# Patient Record
Sex: Male | Born: 1975 | Race: Black or African American | Hispanic: No | Marital: Single | Smoking: Never smoker
Health system: Southern US, Community
[De-identification: ages and names within clinical notes are randomized; demographics above are authoritative.]

## PROBLEM LIST (undated history)

## (undated) DIAGNOSIS — R51 Headache: Secondary | ICD-10-CM

## (undated) DIAGNOSIS — D649 Anemia, unspecified: Secondary | ICD-10-CM

## (undated) DIAGNOSIS — T7840XA Allergy, unspecified, initial encounter: Secondary | ICD-10-CM

## (undated) DIAGNOSIS — R519 Headache, unspecified: Secondary | ICD-10-CM

## (undated) DIAGNOSIS — J45909 Unspecified asthma, uncomplicated: Secondary | ICD-10-CM

## (undated) HISTORY — PX: ELBOW FRACTURE SURGERY: SHX616

## (undated) HISTORY — DX: Allergy, unspecified, initial encounter: T78.40XA

## (undated) HISTORY — PX: OTHER SURGICAL HISTORY: SHX169

---

## 2006-05-02 ENCOUNTER — Emergency Department (HOSPITAL_COMMUNITY): Admission: EM | Admit: 2006-05-02 | Discharge: 2006-05-02 | Payer: Self-pay | Admitting: Emergency Medicine

## 2006-08-23 ENCOUNTER — Emergency Department (HOSPITAL_COMMUNITY): Admission: EM | Admit: 2006-08-23 | Discharge: 2006-08-23 | Payer: Self-pay | Admitting: *Deleted

## 2012-07-23 ENCOUNTER — Emergency Department (HOSPITAL_COMMUNITY)
Admission: EM | Admit: 2012-07-23 | Discharge: 2012-07-23 | Disposition: A | Discharge status: Home / Self Care | Payer: BC Managed Care – PPO | Source: Home / Self Care | Attending: Emergency Medicine | Admitting: Emergency Medicine

## 2012-07-23 ENCOUNTER — Encounter (HOSPITAL_COMMUNITY): Payer: Self-pay | Admitting: *Deleted

## 2012-07-23 DIAGNOSIS — T7840XA Allergy, unspecified, initial encounter: Secondary | ICD-10-CM

## 2012-07-23 DIAGNOSIS — L509 Urticaria, unspecified: Secondary | ICD-10-CM

## 2012-07-23 DIAGNOSIS — T783XXA Angioneurotic edema, initial encounter: Secondary | ICD-10-CM

## 2012-07-23 HISTORY — DX: Allergy, unspecified, initial encounter: T78.40XA

## 2012-07-23 MED ORDER — METHYLPREDNISOLONE SODIUM SUCC 125 MG IJ SOLR
125.0000 mg | Freq: Once | INTRAMUSCULAR | Status: AC
  Administered 2012-07-23: 125 mg via INTRAMUSCULAR

## 2012-07-23 MED ORDER — RANITIDINE HCL 150 MG PO CAPS: 150.0000 mg | ORAL_CAPSULE | Freq: Every day | ORAL | Status: DC

## 2012-07-23 MED ORDER — EPINEPHRINE 0.3 MG/0.3ML IJ SOAJ: INTRAMUSCULAR | Status: DC

## 2012-07-23 MED ORDER — CETIRIZINE HCL 10 MG PO TABS: 10.0000 mg | ORAL_TABLET | Freq: Every day | ORAL | Status: DC

## 2012-07-23 MED ORDER — PREDNISONE 10 MG PO KIT: PACK | ORAL | Status: DC

## 2012-07-23 MED ORDER — METHYLPREDNISOLONE SODIUM SUCC 125 MG IJ SOLR
INTRAMUSCULAR | Status: AC
  Filled 2012-07-23: qty 2

## 2012-07-23 NOTE — ED Provider Notes (Signed)
Medical screening examination/treatment/procedure(s) were performed by non-physician practitioner and as supervising physician I was immediately available for consultation/collaboration.  Leslee Home, M.D.  Reuben Likes, MD 07/23/12 [id number]

## 2012-07-23 NOTE — ED Provider Notes (Signed)
History    CSN: [id number] Arrival date & time 07/23/12  1312  First MD Initiated Contact with Patient 07/23/12 1520     Chief Complaint  Patient presents with  . Allergic Reaction   (Consider location/radiation/quality/duration/timing/severity/associated sxs/prior Treatment) HPI Comments: Allergic reaction, itchy rash, swelling of forehead. Taking Benadryl, no relief. Swelling increasing.  Patient is a 37 y.o. male presenting with allergic reaction.  Allergic Reaction Presenting symptoms: rash   Presenting symptoms: no difficulty swallowing and no wheezing    Past Medical History  Diagnosis Date  . Allergic    History reviewed. No pertinent past surgical history. No family history on file. History  Substance Use Topics  . Smoking status: Never Smoker   . Smokeless tobacco: Not on file  . Alcohol Use: No    Review of Systems  Constitutional: Negative for fever, chills and fatigue.  HENT: Negative for sore throat, facial swelling, mouth sores, trouble swallowing, neck pain, neck stiffness and voice change.   Eyes: Negative for visual disturbance.  Respiratory: Negative for cough, choking, chest tightness, shortness of breath, wheezing and stridor.   Cardiovascular: Negative for chest pain, palpitations and leg swelling.  Gastrointestinal: Negative for nausea, vomiting, abdominal pain, diarrhea and constipation.  Genitourinary: Negative for dysuria, urgency, frequency and hematuria.  Musculoskeletal: Negative for myalgias, joint swelling and arthralgias.       Swelling of scalp and forehead  Skin: Positive for rash.  Allergic/Immunologic: Positive for environmental allergies and food allergies.  Neurological: Negative for dizziness, weakness and light-headedness.    Allergies  Shellfish allergy  Home Medications   Current Outpatient Rx  Name  Route  Sig  Dispense  Refill  . DiphenhydrAMINE HCl (BENADRYL PO)   Oral   Take by mouth.         . cetirizine  (ZYRTEC) 10 MG tablet   Oral   Take 1 tablet (10 mg total) by mouth daily.   30 tablet   0   . EPINEPHrine (EPIPEN 2-PAK) 0.3 mg/0.3 mL SOAJ      If needed for shortness of breath or swelling of lips/tongue, Inject 1 pen into thigh muscle and called 911.  May inject 2nd pen in 5 minutes if needed   1 Device   1   . PredniSONE 10 MG KIT      12 day taper dose pack, use as directed   48 each   0   . ranitidine (ZANTAC) 150 MG capsule   Oral   Take 1 capsule (150 mg total) by mouth daily.   30 capsule   0    BP 116/77  Pulse 62  Temp(Src) 99.8 F (37.7 C) (Oral)  Resp 18  SpO2 100% Physical Exam  Constitutional: He is oriented to person, place, and time. He appears well-developed and well-nourished. No distress.  HENT:  Head: Normocephalic and atraumatic.  Mouth/Throat: Uvula is midline, oropharynx is clear and moist and mucous membranes are normal. No edematous. No posterior oropharyngeal edema.  3+ pitting edema on the forehead and scalp, stopping at the eyebrows, ears, and base of the occiput  Eyes: EOM are normal. Pupils are equal, round, and reactive to light.  Cardiovascular: Normal rate and regular rhythm.  Exam reveals no gallop and no friction rub.   No murmur heard. Pulmonary/Chest: Effort normal and breath sounds normal. No respiratory distress. He has no wheezes. He has no rales.  Abdominal: Soft. There is no tenderness.  Neurological: He is oriented to person, place, and  time.  Skin: Skin is warm and dry. Rash noted. Rash is urticarial (diffuse).  Psychiatric: He has a normal mood and affect. Judgment normal.    ED Course  Procedures (including critical care time) Labs Reviewed - No data to display No results found. 1. Allergic reaction, initial encounter   2. Urticaria   3. Allergic angioedema, initial encounter    Given 125 Solu-Medrol IM today in the office  MDM  We'll treat this with oral steroids, antihistamines, H2 blocker. We'll also give him  an EpiPen, patient instructed to call 911 immediately after using it if he does in fact have to use the EpiPen. He will followup here in 2 days for a recheck.   Meds ordered this encounter  Medications  . DiphenhydrAMINE HCl (BENADRYL PO)    Sig: Take by mouth.  . methylPREDNISolone sodium succinate (SOLU-MEDROL) 125 mg/2 mL injection 125 mg    Sig:   . PredniSONE 10 MG KIT    Sig: 12 day taper dose pack, use as directed    Dispense:  48 each    Refill:  0  . ranitidine (ZANTAC) 150 MG capsule    Sig: Take 1 capsule (150 mg total) by mouth daily.    Dispense:  30 capsule    Refill:  0  . cetirizine (ZYRTEC) 10 MG tablet    Sig: Take 1 tablet (10 mg total) by mouth daily.    Dispense:  30 tablet    Refill:  0  . EPINEPHrine (EPIPEN 2-PAK) 0.3 mg/0.3 mL SOAJ    Sig: If needed for shortness of breath or swelling of lips/tongue, Inject 1 pen into thigh muscle and called 911.  May inject 2nd pen in 5 minutes if needed    Dispense:  1 Device    Refill:  1     Graylon Good, PA-C 07/23/12 1700

## 2012-07-23 NOTE — ED Notes (Signed)
Pt  Reports  Swelling     To  Face  And  Forehead   No  Swelling   To  Mouth  And    Lips           Symptoms  Started    8  Days  Ago  Getting  Worse   Started  Out  As     Slight  Rash       Getting  Worse         Pt  Has  Taking  Benadryl  Without  releif     Pt  Reports       Symptoms   Started  When  He  Went  To  So [place]       No  resp  Problems        Pt  States        Ate  Some     Shellfish            About  That  Time      He  Is  Sitting  Upright on  Exam  Table    Speaking in  Complete  sentances

## 2012-07-25 ENCOUNTER — Emergency Department (INDEPENDENT_AMBULATORY_CARE_PROVIDER_SITE_OTHER)
Admission: EM | Admit: 2012-07-25 | Discharge: 2012-07-25 | Disposition: A | Discharge status: Home / Self Care | Payer: BC Managed Care – PPO | Source: Home / Self Care | Attending: Family Medicine | Admitting: Family Medicine

## 2012-07-25 ENCOUNTER — Encounter (HOSPITAL_COMMUNITY): Payer: Self-pay | Admitting: *Deleted

## 2012-07-25 DIAGNOSIS — L272 Dermatitis due to ingested food: Secondary | ICD-10-CM

## 2012-07-25 MED ORDER — HYDROXYZINE HCL 25 MG PO TABS: 25.0000 mg | ORAL_TABLET | Freq: Three times a day (TID) | ORAL | Status: DC | PRN

## 2012-07-25 NOTE — ED Notes (Signed)
Pt  Was  Seen   2  Days  Ago  For  Facial  Swelling        And  Allergic  Reaction       The  Swelling on  His  Forehead  Is  Much  Better  Still  Has  puffyness   Around  His  Eyes         His  Rash on   The  Rest of  His  Body  Is  Better   He  Is  In no  resp  Distress   He  Is  Sitting  Upright on   Exam table  Speaking  In complete  sentances   He  Has  Not  Gotten his    Epi pen rx  Yet  Due  To  Finances         Yet he  Got  Everything  Karolee Ohs

## 2012-07-25 NOTE — ED Provider Notes (Signed)
History    CSN: [id number] Arrival date & time 07/25/12  1530  First MD Initiated Contact with Patient 07/25/12 1536     Chief Complaint  Patient presents with  . Follow-up   (Consider location/radiation/quality/duration/timing/severity/associated sxs/prior Treatment) HPI Comments: And 37 year old male with history of food allergies to shellfish. Here for followup of with to carry and angioedema symptoms. He was seen on June 6 after he got exposed to arise with shellfish with severe swelling in his forehead, around the eyes and lips. Patient was treated with Solu-Medrol IM and is on day 3/12 of a prednisone taper currently he's also taking ranitidine and cetirizine daily. States symptoms are much better swelling is significantly down. Denies difficulty breathing or swallowing. No wheezing or shortness of breath. Still mild generalized can itchiness. Otherwise doing much better.  Past Medical History  Diagnosis Date  . Allergic    History reviewed. No pertinent past surgical history. No family history on file. History  Substance Use Topics  . Smoking status: Never Smoker   . Smokeless tobacco: Not on file  . Alcohol Use: No    Review of Systems  Constitutional: Negative for fever, chills, diaphoresis, activity change, appetite change and fatigue.  HENT: Negative for congestion, sore throat and trouble swallowing.   Eyes: Negative for photophobia, pain, redness, itching and visual disturbance.  Respiratory: Negative for shortness of breath and wheezing.   Cardiovascular: Negative for leg swelling.  Gastrointestinal: Negative for nausea, vomiting, abdominal pain and diarrhea.  Musculoskeletal: Negative for joint swelling and arthralgias.  Skin: Positive for rash.  Allergic/Immunologic: Positive for food allergies.  Neurological: Negative for dizziness and headaches.    Allergies  Shellfish allergy  Home Medications   Current Outpatient Rx  Name  Route  Sig  Dispense   Refill  . cetirizine (ZYRTEC) 10 MG tablet   Oral   Take 1 tablet (10 mg total) by mouth daily.   30 tablet   0   . EPINEPHrine (EPIPEN 2-PAK) 0.3 mg/0.3 mL SOAJ      If needed for shortness of breath or swelling of lips/tongue, Inject 1 pen into thigh muscle and called 911.  May inject 2nd pen in 5 minutes if needed   1 Device   1   . hydrOXYzine (ATARAX/VISTARIL) 25 MG tablet   Oral   Take 1 tablet (25 mg total) by mouth every 8 (eight) hours as needed for itching.   21 tablet   0   . PredniSONE 10 MG KIT      12 day taper dose pack, use as directed   48 each   0   . ranitidine (ZANTAC) 150 MG capsule   Oral   Take 1 capsule (150 mg total) by mouth daily.   30 capsule   0    BP 119/74  Pulse 59  Temp(Src) 98 F (36.7 C) (Oral)  Resp 16  SpO2 100% Physical Exam  Nursing note and vitals reviewed. Constitutional: He is oriented to person, place, and time. He appears well-developed and well-nourished. No distress.  HENT:  Head: Normocephalic and atraumatic.  Right Ear: External ear normal.  Left Ear: External ear normal.  Nose: Nose normal.  Mouth/Throat: Oropharynx is clear and moist. No oropharyngeal exudate.  No pharyngeal or uvula edema or erythema. Airways patent.  Eyes: Conjunctivae and EOM are normal. Pupils are equal, round, and reactive to light. No scleral icterus.  Still periorbital edema bilaterally. No tender to palpation. No redness.   Neck:  Neck supple. No JVD present.  Cardiovascular: Normal rate, regular rhythm and normal heart sounds.  Exam reveals no gallop and no friction rub.   No murmur heard. Pulmonary/Chest: Effort normal and breath sounds normal. No respiratory distress. He has no wheezes. He has no rales.  Abdominal: Soft. He exhibits no distension. There is no tenderness.  No hepatosplenomegally.  Lymphadenopathy:    He has no cervical adenopathy.  Neurological: He is alert and oriented to person, place, and time.  Skin: Rash noted.  He is not diaphoretic.  Improved facial edema.     ED Course  Procedures (including critical care time) Labs Reviewed - No data to display No results found. 1. Food allergic skin reaction     MDM  Significant improvement of urticaria and angioedema after shellfish ingestion. Continue with prednisone taper, ranitidine and cetirizine. Recommended close to remain is very close possible overnight. Also recommended cold pads around the eyes. Added hydroxyzine. Patient had prescribed EpiPen on last visit. Supportive care and red flags should prompt his return to medical attention discussed with patient and provided in writing.  Sharin Grave, MD 07/25/12 1616

## 2012-08-24 ENCOUNTER — Ambulatory Visit (INDEPENDENT_AMBULATORY_CARE_PROVIDER_SITE_OTHER): Payer: BC Managed Care – PPO | Admitting: Family Medicine

## 2012-08-24 ENCOUNTER — Encounter: Payer: Self-pay | Admitting: Family Medicine

## 2012-08-24 VITALS — BP 100/68 | Temp 98.6°F | Ht 71.75 in | Wt 200.0 lb

## 2012-08-24 DIAGNOSIS — Z23 Encounter for immunization: Secondary | ICD-10-CM

## 2012-08-24 DIAGNOSIS — Z9109 Other allergy status, other than to drugs and biological substances: Secondary | ICD-10-CM | POA: Insufficient documentation

## 2012-08-24 DIAGNOSIS — Z7189 Other specified counseling: Secondary | ICD-10-CM

## 2012-08-24 DIAGNOSIS — Z7689 Persons encountering health services in other specified circumstances: Secondary | ICD-10-CM

## 2012-08-24 DIAGNOSIS — Z136 Encounter for screening for cardiovascular disorders: Secondary | ICD-10-CM

## 2012-08-24 LAB — LIPID PANEL
Total CHOL/HDL Ratio: 4
VLDL: 10.6 mg/dL (ref 0.0–40.0)

## 2012-08-24 LAB — HEMOGLOBIN A1C: Hgb A1c MFr Bld: 6 % (ref 4.6–6.5)

## 2012-08-24 NOTE — Patient Instructions (Signed)
-  We have ordered labs or studies at this visit. It can take up to 1-2 weeks for results and processing. We will contact you with instructions IF your results are abnormal. Normal results will be released to your MYCHART. If you have not heard from us or can not find your results in MYCHART in 2 weeks please contact our office.  -PLEASE SIGN UP FOR MYCHART TODAY   We recommend the following healthy lifestyle measures: - eat a healthy diet consisting of lots of vegetables, fruits, beans, nuts, seeds, healthy meats such as white chicken and fish and whole grains.  - avoid fried foods, fast food, processed foods, sodas, red meet and other fattening foods.  - get a least 150 minutes of aerobic exercise per week.   Follow up in: as needed  

## 2012-08-24 NOTE — Progress Notes (Addendum)
Chief Complaint  Patient presents with  . Establish Care    HPI:  Colin Nichols is here to establish care. Has had change in insurance and had to change PCP - but has not seen doctor in a long time.  Has the following chronic problems and concerns today:  Patient Active Problem List   Diagnosis Date Noted  . Environmental allergies 08/24/2012   Environmental Allergies: -saw allergist as a youngster -only has rare reactions, usually hives  Health Maintenance:  -needs tdap today  ROS: See pertinent positives and negatives per HPI.  Past Medical History  Diagnosis Date  . Allergic   . Allergy     Family History  Problem Relation Age of Onset  . Cancer Mother     cervical  . Hypertension Father   . Diabetes Paternal Grandfather     History   Social History  . Marital Status: Single    Spouse Name: N/A    Number of Children: N/A  . Years of Education: N/A   Social History Main Topics  . Smoking status: Never Smoker   . Smokeless tobacco: None  . Alcohol Use: Yes     Comment: occ; 1-2 drinks rarely  . Drug Use: None  . Sexually Active: None   Other Topics Concern  . 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  May inject 2nd pen in 5 minutes if needed, Disp: 1 Device, Rfl: 1;  [DISCONTINUED] DiphenhydrAMINE HCl (BENADRYL PO), Take by mouth., Disp: , Rfl:   EXAM:  Filed Vitals:   08/24/12 0936  BP: 100/68  Temp: 98.6 F (37 C)    Body mass index is 27.33 kg/(m^2).  GENERAL: vitals reviewed and listed above,  alert, oriented, appears well hydrated and in no acute distress  HEENT: atraumatic, conjunttiva clear, no obvious abnormalities on inspection of external nose and ears  NECK: no obvious masses on inspection  LUNGS: clear to auscultation bilaterally, no wheezes, rales or rhonchi, good air movement  CV: HRRR, no peripheral edema  MS: moves all extremities without noticeable abnormality  PSYCH: pleasant and cooperative, no obvious depression or anxiety  ASSESSMENT AND PLAN:  Discussed the following assessment and plan:  Environmental allergies -advised of signs/symptoms of serious allergic reaction and advised to call 911 if ever experiences these symptoms, offered refill on epi-pen he reports had recent refill and has on hand  Encounter to establish care - Plan: Lipid panel, Hemoglobin A1c   -We reviewed the PMH, PSH, FH, SH, Meds and Allergies. -We provided refills for any medications we will prescribe as needed. -We addressed current concerns per orders and patient instructions. -We have asked for records for pertinent exams, studies, vaccines and notes from previous providers. -We have advised patient to follow up per instructions below. -FASTING LABS   -Patient advised to return or notify a doctor immediately if symptoms worsen or persist or new concerns arise.  Patient Instructions  -We have  ordered labs or studies at this visit. It can take up to 1-2 weeks for results and processing. We will contact you with instructions IF your results are abnormal. Normal results will be released to your [facility] If you have not heard from [place] or can not find your results in [facility] in 2 weeks please contact our office.  -PLEASE SIGN UP FOR MYCHART TODAY   We recommend the following healthy lifestyle measures: - eat a healthy diet consisting of lots of vegetables, fruits, beans, nuts, seeds, healthy meats such as white chicken and fish and whole grains.  - avoid fried foods, fast food,  processed foods, sodas, red meet and other fattening foods.  - get a least 150 minutes of aerobic exercise per week.   Follow up in: as needed      Kriste Basque R.

## 2012-08-24 NOTE — Addendum Note (Signed)
Addended by: Azucena Freed on: 08/24/2012 10:36 AM   Modules accepted: Orders

## 2012-08-25 NOTE — Progress Notes (Signed)
Quick Note:  Called and spoke with pt and pt is aware. ______ 

## 2012-09-01 ENCOUNTER — Ambulatory Visit: Payer: BC Managed Care – PPO | Admitting: Family Medicine

## 2013-02-08 ENCOUNTER — Encounter: Payer: Self-pay | Admitting: Family Medicine

## 2013-02-08 ENCOUNTER — Ambulatory Visit (INDEPENDENT_AMBULATORY_CARE_PROVIDER_SITE_OTHER): Payer: Self-pay | Admitting: Family Medicine

## 2013-02-08 VITALS — BP 108/72 | Temp 98.6°F | Ht 71.0 in | Wt 199.0 lb

## 2013-02-08 DIAGNOSIS — R310 Gross hematuria: Secondary | ICD-10-CM

## 2013-02-08 DIAGNOSIS — R7303 Prediabetes: Secondary | ICD-10-CM

## 2013-02-08 DIAGNOSIS — R7309 Other abnormal glucose: Secondary | ICD-10-CM

## 2013-02-08 DIAGNOSIS — Z0000 Encounter for general adult medical examination without abnormal findings: Secondary | ICD-10-CM

## 2013-02-08 DIAGNOSIS — E785 Hyperlipidemia, unspecified: Secondary | ICD-10-CM

## 2013-02-08 LAB — URINALYSIS, ROUTINE W REFLEX MICROSCOPIC
HGB URINE DIPSTICK: NEGATIVE
KETONES UR: NEGATIVE
Leukocytes, UA: NEGATIVE
Nitrite: NEGATIVE
RBC / HPF: NONE SEEN (ref 0–?)
TOTAL PROTEIN, URINE-UPE24: 30 — AB
URINE GLUCOSE: NEGATIVE
Urobilinogen, UA: 0.2 (ref 0.0–1.0)
pH: 6 (ref 5.0–8.0)

## 2013-02-08 LAB — LIPID PANEL
CHOLESTEROL: 145 mg/dL (ref 0–200)
HDL: 39.9 mg/dL (ref >39.00)
LDL Cholesterol: 99 mg/dL (ref 0–99)
Total CHOL/HDL Ratio: 4
Triglycerides: 31 mg/dL (ref 0.0–149.0)
VLDL: 6.2 mg/dL (ref 0.0–40.0)

## 2013-02-08 LAB — BASIC METABOLIC PANEL
BUN: 14 mg/dL (ref 6–23)
CO2: 30 mEq/L (ref 19–32)
CREATININE: 1.2 mg/dL (ref 0.4–1.5)
Calcium: 9.5 mg/dL (ref 8.4–10.5)
Chloride: 104 mEq/L (ref 96–112)
GFR: 84.29 mL/min (ref >60.00)
Glucose, Bld: 89 mg/dL (ref 70–99)
Potassium: 4.5 mEq/L (ref 3.5–5.1)
Sodium: 141 mEq/L (ref 135–145)

## 2013-02-08 LAB — HEMOGLOBIN A1C: HEMOGLOBIN A1C: 5.8 % (ref 4.6–6.5)

## 2013-02-08 NOTE — Progress Notes (Signed)
Chief Complaint  Patient presents with  . Annual Exam    HPI:  Here for annual exam:  Concerns today: Prediabetes: Doing well, working outside quite a bit. Diet is not great. Has been going to the gym on a regular basis - 2-3 days per week.  Hematuria: -dark urine once a few weeks ago and saw blood in urine once -denies beets, rhubarb, etc -no symptoms -denies: fevers, pain, unexplained weight loss, penile discharge, penile issues, concern for STIs  ROS: See pertinent positives and negatives per HPI. Review of Systems  Constitutional: Negative for fever, chills, weight loss and malaise/fatigue.  HENT: Negative for hearing loss.   Eyes: Negative for blurred vision and double vision.  Respiratory: Negative for shortness of breath.   Cardiovascular: Negative for chest pain, palpitations and leg swelling.  Gastrointestinal: Negative for vomiting, diarrhea, blood in stool and melena.       Had streak of blood on TP after hard stool remotely  Genitourinary: Positive for hematuria. Negative for dysuria, urgency, frequency and flank pain.  Musculoskeletal: Negative for falls.  Skin: Negative for rash.  Neurological: Negative for dizziness.  Endo/Heme/Allergies: Does not bruise/bleed easily.  Psychiatric/Behavioral: Negative for depression.     Past Medical History  Diagnosis Date  . Allergic   . Allergy     Past Surgical History  Procedure Laterality Date  . R ankle surgery      Family History  Problem Relation Age of Onset  . Cancer Mother     cervical  . Hypertension Father   . Diabetes Paternal Grandfather     History   Social History  . Marital Status: Single    Spouse Name: N/A    Number of Children: N/A  . Years of Education: N/A   Social History Main Topics  . Smoking status: Never Smoker   . Smokeless tobacco: None  . Alcohol Use: Yes     Comment: occ; 1-2 drinks rarely  . Drug Use: None  . Sexual Activity: None   Other Topics Concern  . 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  May inject 2nd pen in 5 minutes if needed, Disp: 1 Device, Rfl: 1;  [DISCONTINUED] DiphenhydrAMINE HCl (BENADRYL PO), Take by mouth., Disp: , Rfl:   EXAM:  Filed Vitals:   02/08/13 0943  BP: 108/72  Temp: 98.6 F (37 C)    Body mass index is 27.77 kg/(m^2).  GENERAL: vitals reviewed and listed above, alert, oriented, appears well hydrated and in no acute distress  HEENT: atraumatic, conjunttiva clear, no obvious abnormalities on inspection of external nose and ears  NECK: no obvious masses on inspection  LUNGS: clear to auscultation bilaterally, no wheezes, rales or rhonchi, good air movement  CV: HRRR, no peripheral edema  MS: moves all extremities without noticeable abnormality  PSYCH: pleasant and cooperative, no obvious depression or anxiety  ASSESSMENT AND PLAN:  Discussed the following assessment and plan:  Visit for preventive health examination - Plan: Basic metabolic panel  Prediabetes - Plan: Hemoglobin A1c  Dyslipidemia - Plan: Lipid Panel  Gross hematuria - Plan: Urinalysis with Reflex Microscopic, Culture, Urine  -discussed level a ab USPSTF recommendations -FASTING LABS -UA and culture for gross hematuria a few weeks ago - -we discussed possible serious and likely etiologies, workup and treatment, treatment risks and return precautions -after this discussion, Kandis MannanOmar opted for checking urine today, urologist if hematuria without  infection, return if recurs -of course, we advised Kandis MannanOmar  to return or notify a doctor immediately if symptoms worsen or persist or new concerns arise.  -Patient advised to return or notify a doctor immediately if symptoms worsen or persist or new concerns arise.  Patient Instructions  -We have ordered labs or studies at this visit. It can take up to 1-2 weeks for results and processing. We will contact you with instructions IF your results are abnormal. Normal results will be released to your [facility]. If you have not heard from us or can not find your results in [facility] in 2 weeks please contact our office.  -PLEASE SIGN UP FOR MYCHART TODAY   We recommend the following healthy lifestyle measures: - eat a healthy diet consisting of lots of vegetables, fruits, beans, nuts, seeds, healthy meats such as white chicken and fish and whole grains.  - avoid fried foods, fast food, processed foods, sodas, red meet and other fattening foods.  - get a least 150 minutes of aerobic exercise per week.   Follow up in: 1 year or as needed and if any further urine concerns      Colin Spinner R.

## 2013-02-08 NOTE — Patient Instructions (Signed)
-  We have ordered labs or studies at this visit. It can take up to 1-2 weeks for results and processing. We will contact you with instructions IF your results are abnormal. Normal results will be released to your [facility]. If you have not heard from us or can not find your results in [facility] in 2 weeks please contact our office.  -PLEASE SIGN UP FOR MYCHART TODAY   We recommend the following healthy lifestyle measures: - eat a healthy diet consisting of lots of vegetables, fruits, beans, nuts, seeds, healthy meats such as white chicken and fish and whole grains.  - avoid fried foods, fast food, processed foods, sodas, red meet and other fattening foods.  - get a least 150 minutes of aerobic exercise per week.   Follow up in: 1 year or as needed and if any further urine concerns

## 2013-02-08 NOTE — Progress Notes (Signed)
Pre visit review using our clinic review tool, if applicable. No additional management support is needed unless otherwise documented below in the visit note. 

## 2013-02-10 LAB — URINE CULTURE
Colony Count: NO GROWTH
ORGANISM ID, BACTERIA: NO GROWTH

## 2013-02-10 MED ORDER — SULFAMETHOXAZOLE-TMP DS 800-160 MG PO TABS: 1.0000 | ORAL_TABLET | Freq: Two times a day (BID) | ORAL | Status: DC

## 2013-02-10 NOTE — Addendum Note (Signed)
Addended by: Terressa KoyanagiKIM, Saladin Petrelli R on: 02/10/2013 04:43 PM   Modules accepted: Orders

## 2013-06-03 ENCOUNTER — Telehealth: Payer: Self-pay | Admitting: Family Medicine

## 2013-06-03 MED ORDER — ALBUTEROL SULFATE HFA 108 (90 BASE) MCG/ACT IN AERS: 2.0000 | INHALATION_SPRAY | RESPIRATORY_TRACT | Status: DC | PRN

## 2013-06-03 NOTE — Telephone Encounter (Signed)
You are correct, I do not see that we discussed that he had asthma or ever gave him this.   Can you talk with him and ask if he has used albuterol long term in the past and if so for what?   If he is having mild and occasional wheezing or breathing issue and used albuterol in the past ok to send rx for 1 proair inhaler, 2 puffs q4hours prn, with 1 refill and needs follow up appt in next 1-2 months. Sooner if albuterol not working.   If this is a new symptoms or struggling to breath or feeling unwell needs appointment right away.  Thanks.

## 2013-06-03 NOTE — Telephone Encounter (Signed)
Pt called and req a  Inhaler for his bronchial asthma

## 2013-06-03 NOTE — Telephone Encounter (Signed)
Pt notified, pt states that he had asthma as a kid and grew out of it, and used to take albuterol.  I sent in the rx for proair and scheduled him for an appt with you for a 2 month f/u

## 2013-08-08 ENCOUNTER — Ambulatory Visit: Payer: BC Managed Care – PPO | Admitting: Family Medicine

## 2013-10-25 ENCOUNTER — Ambulatory Visit (INDEPENDENT_AMBULATORY_CARE_PROVIDER_SITE_OTHER): Payer: BC Managed Care – PPO | Admitting: Family Medicine

## 2013-10-25 ENCOUNTER — Encounter: Payer: Self-pay | Admitting: Family Medicine

## 2013-10-25 VITALS — BP 100/82 | HR 78 | Temp 98.5°F | Ht 71.0 in | Wt 202.5 lb

## 2013-10-25 DIAGNOSIS — R739 Hyperglycemia, unspecified: Secondary | ICD-10-CM

## 2013-10-25 DIAGNOSIS — J4520 Mild intermittent asthma, uncomplicated: Secondary | ICD-10-CM

## 2013-10-25 DIAGNOSIS — Z87448 Personal history of other diseases of urinary system: Secondary | ICD-10-CM

## 2013-10-25 DIAGNOSIS — Z113 Encounter for screening for infections with a predominantly sexual mode of transmission: Secondary | ICD-10-CM

## 2013-10-25 DIAGNOSIS — R8290 Unspecified abnormal findings in urine: Secondary | ICD-10-CM

## 2013-10-25 LAB — POCT URINALYSIS DIPSTICK
Blood, UA: NEGATIVE
GLUCOSE UA: NEGATIVE
Leukocytes, UA: NEGATIVE
Nitrite, UA: NEGATIVE
SPEC GRAV UA: 1.02
UROBILINOGEN UA: 2
pH, UA: 6

## 2013-10-25 LAB — BASIC METABOLIC PANEL
BUN: 12 mg/dL (ref 6–23)
CALCIUM: 9.4 mg/dL (ref 8.4–10.5)
CO2: 31 meq/L (ref 19–32)
Chloride: 104 mEq/L (ref 96–112)
Creatinine, Ser: 1.3 mg/dL (ref 0.4–1.5)
GFR: 77.45 mL/min (ref >60.00)
Glucose, Bld: 93 mg/dL (ref 70–99)
Potassium: 4.3 mEq/L (ref 3.5–5.1)
SODIUM: 140 meq/L (ref 135–145)

## 2013-10-25 LAB — URINALYSIS, MICROSCOPIC ONLY

## 2013-10-25 LAB — HEMOGLOBIN A1C: HEMOGLOBIN A1C: 5.7 % (ref 4.6–6.5)

## 2013-10-25 NOTE — Progress Notes (Signed)
No chief complaint on file.   HPI:  Follow up:  Prediabetes:  Doing well, working outside quite a bit.  Diet is ok Has been goinng to the gym on a regular basis - 2-3 days per week.   Hx Hematuria:  -in Jan 2015, some mucus, P, sperm in UA, culture neg - tx for possible prostatitis and he was to return for repeat urine test or see urology - never followed up -reports: no further symptoms -denies: fevers, pain, unexplained weight loss, penile discharge, penile issues  Mild Intermittent Asthma: -dx as a child -rare alb prn use as an adult -reports: only gets asthma symptoms rarely with URI -denies: SOB, wheezing  ROS: See pertinent positives and negatives per HPI.  Past Medical History  Diagnosis Date  . Allergic   . Allergy     Past Surgical History  Procedure Laterality Date  . R ankle surgery      Family History  Problem Relation Age of Onset  . Cancer Mother     cervical  . Hypertension Father   . Diabetes Paternal Grandfather     History   Social History  . Marital Status: Single    Spouse Name: N/A    Number of Children: N/A  . Years of Education: N/A   Social History Main Topics  . Smoking status: Never Smoker   . Smokeless tobacco: None  . Alcohol Use: Yes     Comment: occ; 1-2 drinks rarely  . Drug Use: None  . Sexual Activity: None   Other Topics Concern  . 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  May inject  2nd pen in 5 minutes if needed, Disp: 1 Device, Rfl: 1;  [DISCONTINUED] DiphenhydrAMINE HCl (BENADRYL PO), Take by mouth., Disp: , Rfl:   EXAM:  Filed Vitals:   10/25/13 1341  BP: 100/82  Pulse: 78  Temp: 98.5 F (36.9 C)    Body mass index is 28.26 kg/(m^2).  GENERAL: vitals reviewed and listed above, alert, oriented, appears well hydrated and in no acute distress  HEENT: atraumatic, conjunttiva clear, no obvious abnormalities on inspection of external nose and ears  NECK: no obvious masses on inspection  LUNGS: clear to auscultation bilaterally, no wheezes, rales or rhonchi, good air movement  CV: HRRR, no peripheral edema  MS: moves all extremities without noticeable abnormality  PSYCH: pleasant and cooperative, no obvious depression or anxiety  ASSESSMENT AND PLAN:  Discussed the following assessment and plan:  Hx of hematuria - Plan: Basic metabolic panel, Urinalysis, microscopic only, Urinalysis -symptoms resolved and he never followed up to recheck urine - advised we recheck and if abnormal see urologist.  Routine screening for STI (sexually transmitted infection) - Plan: HIV antibody (with reflex), RPR, GC/chlamydia probe amp, urine - per his request - reports he likes to do this  every year  Hyperglycemia - Plan: Hemoglobin A1C, Basic metabolic panel  Mild intermittent asthma, uncomplicated  -Patient advised to return or notify a doctor immediately if symptoms worsen or persist or new concerns arise.  There are no Patient Instructions on file for this visit.   Kriste Basque R.

## 2013-10-25 NOTE — Progress Notes (Signed)
Pre visit review using our clinic review tool, if applicable. No additional management support is needed unless otherwise documented below in the visit note. 

## 2013-10-25 NOTE — Addendum Note (Signed)
Addended by: Rita OharaHRASHER, Yexalen Deike R on: 10/25/2013 02:01 PM   Modules accepted: Orders

## 2013-10-26 LAB — HIV ANTIBODY (ROUTINE TESTING W REFLEX): HIV: NONREACTIVE

## 2013-10-26 LAB — RPR

## 2013-10-27 LAB — GC/CHLAMYDIA PROBE AMP, URINE
CHLAMYDIA, SWAB/URINE, PCR: NEGATIVE
GC PROBE AMP, URINE: NEGATIVE

## 2013-10-27 NOTE — Addendum Note (Signed)
Addended by: Johnella MoloneyFUNDERBURK, Shivangi Lutz A on: 10/27/2013 10:18 AM   Modules accepted: Orders

## 2015-03-04 ENCOUNTER — Encounter (HOSPITAL_COMMUNITY): Payer: Self-pay

## 2015-03-04 ENCOUNTER — Emergency Department (HOSPITAL_COMMUNITY)
Admission: EM | Admit: 2015-03-04 | Discharge: 2015-03-04 | Disposition: A | Discharge status: Home / Self Care | Payer: BLUE CROSS/BLUE SHIELD | Attending: Emergency Medicine | Admitting: Emergency Medicine

## 2015-03-04 ENCOUNTER — Emergency Department (HOSPITAL_COMMUNITY): Payer: BLUE CROSS/BLUE SHIELD

## 2015-03-04 DIAGNOSIS — Y9289 Other specified places as the place of occurrence of the external cause: Secondary | ICD-10-CM | POA: Insufficient documentation

## 2015-03-04 DIAGNOSIS — S43102A Unspecified dislocation of left acromioclavicular joint, initial encounter: Secondary | ICD-10-CM

## 2015-03-04 DIAGNOSIS — Y998 Other external cause status: Secondary | ICD-10-CM | POA: Insufficient documentation

## 2015-03-04 DIAGNOSIS — S4992XA Unspecified injury of left shoulder and upper arm, initial encounter: Secondary | ICD-10-CM | POA: Diagnosis present

## 2015-03-04 DIAGNOSIS — Y9351 Activity, roller skating (inline) and skateboarding: Secondary | ICD-10-CM | POA: Insufficient documentation

## 2015-03-04 MED ORDER — OXYCODONE-ACETAMINOPHEN 5-325 MG PO TABS
2.0000 | ORAL_TABLET | Freq: Once | ORAL | Status: AC
  Administered 2015-03-04: 1 via ORAL
  Filled 2015-03-04 (×2): qty 2

## 2015-03-04 MED ORDER — OXYCODONE-ACETAMINOPHEN 5-325 MG PO TABS: 2.0000 | ORAL_TABLET | ORAL | Status: DC | PRN

## 2015-03-04 NOTE — Discharge Instructions (Signed)
Mr. Colin Nichols,  Nice meeting you! Please follow-up with orthopedics. Return to the emergency department if you develop numbness or tingling in your hand or arm, color changes in your hand or in your arm. Feel better soon!  S. Lane Hacker, PA-C

## 2015-03-04 NOTE — ED Provider Notes (Signed)
CSN: [id number]     Arrival date & time 03/04/15  1919 History   By signing my name below, I, Evon Slack, attest that this documentation has been prepared under the direction and in the presence of Melton Krebs, PA-C. Electronically Signed: Evon Slack, ED Scribe. 03/04/2015. 8:49 PM.    Chief Complaint  Patient presents with  . Shoulder Injury   The history is provided by the patient. No language interpreter was used.   HPI Comments: Colin Nichols is a 40 y.o. male who presents to the Emergency Department complaining of new left shoulder injury onset today at 5 PM. Pt states that he was skateboarding and fell onto the left shoulder. Pt states that the pain is worse with movement. Pt states that he believes he dislocated his shoulder. Pt doesn't report head injury or LOC. Pt denies numbness or tingling.   Past Medical History  Diagnosis Date  . Allergic   . Allergy    Past Surgical History  Procedure Laterality Date  . R ankle surgery     Family History  Problem Relation Age of Onset  . Cancer Mother     cervical  . Hypertension Father   . Diabetes Paternal Grandfather    Social History  Substance Use Topics  . Smoking status: Never Smoker   . Smokeless tobacco: None  . Alcohol Use: Yes     Comment: occ; 1-2 drinks rarely    Review of Systems  Musculoskeletal: Positive for arthralgias.  Neurological: Negative for syncope and numbness.   10 Systems reviewed and all are negative for acute change except as noted in the HPI.   Allergies  Dust mite extract; Lactose intolerance (gi); Pollen extract; and Shellfish allergy  Home Medications   Prior to Admission medications   Medication Sig Start Date End Date Taking? Authorizing Provider  EPINEPHrine (EPIPEN 2-PAK) 0.3 mg/0.3 mL SOAJ If needed for shortness of breath or swelling of lips/tongue, Inject 1 pen into thigh muscle and called 911.  May inject 2nd pen in 5 minutes if needed 07/23/12   Graylon Good, PA-C   BP 121/77 mmHg  Pulse 84  Temp(Src) 98 F (36.7 C) (Oral)  Resp 20  Ht 6' (1.829 m)  Wt 211 lb (95.709 kg)  BMI 28.61 kg/m2  SpO2 99%   Physical Exam  Constitutional: He is oriented to person, place, and time. He appears well-developed and well-nourished. No distress.  HENT:  Head: Normocephalic and atraumatic.  Eyes: Conjunctivae and EOM are normal.  Neck: Neck supple. No tracheal deviation present.  Cardiovascular: Normal rate.   Pulmonary/Chest: Effort normal. No respiratory distress.  Musculoskeletal: Normal range of motion. He exhibits edema and tenderness.  Tenderness at left shoulder and clavicle. Limited ROM due to pain. NVI BL otherwise.  Neurological: He is alert and oriented to person, place, and time.  Skin: Skin is warm and dry.  Psychiatric: He has a normal mood and affect. His behavior is normal.  Nursing note and vitals reviewed.   ED Course  Procedures  DIAGNOSTIC STUDIES: Oxygen Saturation is 99% on RA, normal by my interpretation.    COORDINATION OF CARE: 8:50 PM-Discussed treatment plan with pt at bedside and pt agreed to plan.   Imaging Review Dg Shoulder Left  03/04/2015  CLINICAL DATA:  40 year old male with fall and left shoulder pain. EXAM: LEFT SHOULDER - 2+ VIEW COMPARISON:  None. FINDINGS: There is approximately 17 mm superior displacement of the distal clavicle in relation to  the acromion. There is widening of the Coracoclavicular distance measuring approximately 25 cm indicative of ligamentous injury. No acute fracture identified. The soft tissues are grossly unremarkable. IMPRESSION: Acromioclavicular joint injury.  No acute fracture. Electronically Signed   By: Elgie Collard M.D.   On: 03/04/2015 21:41   I have personally reviewed and evaluated these images as part of my medical decision-making.  MDM   Final diagnoses:  Acromioclavicular joint separation, left, initial encounter   Patient nontoxic appearing, VSS. Patient  refusing pain meds at this time. Xray demonstrating AC joint injury without acute fracture. Spoke with ortho who advised sling and ortho follow-up for surgery. Patient may be safely discharged with sling and percocet.   I personally performed the services described in this documentation, which was scribed in my presence. The recorded information has been reviewed and is accurate.   Melton Krebs, PA-C 03/07/15 2010  Gerhard Munch, MD 03/08/15 1540

## 2015-03-04 NOTE — ED Notes (Signed)
Denies need for pain med at this time.

## 2015-03-04 NOTE — ED Notes (Signed)
Onset around 5:30p pt was skateboarding, went into grass skateboard stopped pt went flying on ground on left shoulder.  Pt thought he had shoulder back in place but not able to move and is very painful.

## 2015-03-07 ENCOUNTER — Encounter (HOSPITAL_COMMUNITY): Payer: Self-pay | Admitting: *Deleted

## 2015-03-07 ENCOUNTER — Other Ambulatory Visit: Payer: Self-pay | Admitting: Orthopaedic Surgery

## 2015-03-07 NOTE — Progress Notes (Signed)
Pt denies cardiac history, chest pain or sob. 

## 2015-03-08 ENCOUNTER — Encounter (HOSPITAL_COMMUNITY)
Admission: RE | Disposition: A | Discharge status: Home / Self Care | Payer: Self-pay | Source: Ambulatory Visit | Attending: Orthopaedic Surgery

## 2015-03-08 ENCOUNTER — Ambulatory Visit (HOSPITAL_COMMUNITY): Payer: BLUE CROSS/BLUE SHIELD | Admitting: Certified Registered Nurse Anesthetist

## 2015-03-08 ENCOUNTER — Ambulatory Visit (HOSPITAL_COMMUNITY): Payer: BLUE CROSS/BLUE SHIELD

## 2015-03-08 ENCOUNTER — Ambulatory Visit (HOSPITAL_COMMUNITY)
Admission: RE | Admit: 2015-03-08 | Discharge: 2015-03-08 | Disposition: A | Discharge status: Home / Self Care | Payer: BLUE CROSS/BLUE SHIELD | Source: Ambulatory Visit | Attending: Orthopaedic Surgery | Admitting: Orthopaedic Surgery

## 2015-03-08 ENCOUNTER — Encounter (HOSPITAL_COMMUNITY): Payer: Self-pay | Admitting: *Deleted

## 2015-03-08 DIAGNOSIS — S43102A Unspecified dislocation of left acromioclavicular joint, initial encounter: Secondary | ICD-10-CM | POA: Diagnosis present

## 2015-03-08 DIAGNOSIS — Z8781 Personal history of (healed) traumatic fracture: Secondary | ICD-10-CM

## 2015-03-08 DIAGNOSIS — X58XXXA Exposure to other specified factors, initial encounter: Secondary | ICD-10-CM | POA: Insufficient documentation

## 2015-03-08 DIAGNOSIS — Z9889 Other specified postprocedural states: Secondary | ICD-10-CM

## 2015-03-08 DIAGNOSIS — Z419 Encounter for procedure for purposes other than remedying health state, unspecified: Secondary | ICD-10-CM

## 2015-03-08 HISTORY — DX: Anemia, unspecified: D64.9

## 2015-03-08 HISTORY — DX: Headache: R51

## 2015-03-08 HISTORY — DX: Unspecified asthma, uncomplicated: J45.909

## 2015-03-08 HISTORY — PX: ACROMIO-CLAVICULAR JOINT REPAIR: SHX5183

## 2015-03-08 HISTORY — DX: Headache, unspecified: R51.9

## 2015-03-08 LAB — CBC
HEMATOCRIT: 45.7 % (ref 39.0–52.0)
HEMOGLOBIN: 15.5 g/dL (ref 13.0–17.0)
MCH: 30 pg (ref 26.0–34.0)
MCHC: 33.9 g/dL (ref 30.0–36.0)
MCV: 88.4 fL (ref 78.0–100.0)
Platelets: 217 10*3/uL (ref 150–400)
RBC: 5.17 MIL/uL (ref 4.22–5.81)
RDW: 12.2 % (ref 11.5–15.5)
WBC: 4.3 10*3/uL (ref 4.0–10.5)

## 2015-03-08 SURGERY — REPAIR, ACROMIOCLAVICULAR JOINT
Anesthesia: Regional | Site: Shoulder | Laterality: Left

## 2015-03-08 MED ORDER — PROPOFOL 10 MG/ML IV BOLUS
INTRAVENOUS | Status: AC
  Filled 2015-03-08: qty 20

## 2015-03-08 MED ORDER — FENTANYL CITRATE (PF) 100 MCG/2ML IJ SOLN
INTRAMUSCULAR | Status: DC | PRN
  Administered 2015-03-08: 100 ug via INTRAVENOUS

## 2015-03-08 MED ORDER — OXYCODONE HCL 5 MG/5ML PO SOLN: 5.0000 mg | Freq: Once | ORAL | Status: DC | PRN

## 2015-03-08 MED ORDER — OXYCODONE HCL 5 MG PO TABS: 5.0000 mg | ORAL_TABLET | ORAL | Status: DC | PRN

## 2015-03-08 MED ORDER — CEFAZOLIN SODIUM-DEXTROSE 2-3 GM-% IV SOLR
2.0000 g | INTRAVENOUS | Status: AC
  Administered 2015-03-08: 2 g via INTRAVENOUS

## 2015-03-08 MED ORDER — ONDANSETRON HCL 4 MG/2ML IJ SOLN
INTRAMUSCULAR | Status: DC | PRN
  Administered 2015-03-08: 4 mg via INTRAVENOUS

## 2015-03-08 MED ORDER — ACETAMINOPHEN 325 MG PO TABS: 325.0000 mg | ORAL_TABLET | ORAL | Status: DC | PRN

## 2015-03-08 MED ORDER — LACTATED RINGERS IV SOLN
INTRAVENOUS | Status: DC
  Administered 2015-03-08 (×2): via INTRAVENOUS

## 2015-03-08 MED ORDER — GLYCOPYRROLATE 0.2 MG/ML IJ SOLN
INTRAMUSCULAR | Status: DC | PRN
  Administered 2015-03-08: .6 mg via INTRAVENOUS

## 2015-03-08 MED ORDER — HYDROMORPHONE HCL 1 MG/ML IJ SOLN: 0.2500 mg | INTRAMUSCULAR | Status: DC | PRN

## 2015-03-08 MED ORDER — ONDANSETRON HCL 4 MG PO TABS: 4.0000 mg | ORAL_TABLET | Freq: Three times a day (TID) | ORAL | Status: DC | PRN

## 2015-03-08 MED ORDER — MIDAZOLAM HCL 2 MG/2ML IJ SOLN
INTRAMUSCULAR | Status: AC
  Administered 2015-03-08: 2 mg
  Filled 2015-03-08: qty 2

## 2015-03-08 MED ORDER — FENTANYL CITRATE (PF) 250 MCG/5ML IJ SOLN
INTRAMUSCULAR | Status: AC
  Filled 2015-03-08: qty 5

## 2015-03-08 MED ORDER — LIDOCAINE HCL (CARDIAC) 20 MG/ML IV SOLN
INTRAVENOUS | Status: DC | PRN
  Administered 2015-03-08: 60 mg via INTRAVENOUS

## 2015-03-08 MED ORDER — MIDAZOLAM HCL 2 MG/2ML IJ SOLN
INTRAMUSCULAR | Status: AC
  Filled 2015-03-08: qty 2

## 2015-03-08 MED ORDER — METHOCARBAMOL 750 MG PO TABS: 750.0000 mg | ORAL_TABLET | Freq: Two times a day (BID) | ORAL | Status: DC | PRN

## 2015-03-08 MED ORDER — BUPIVACAINE-EPINEPHRINE (PF) 0.5% -1:200000 IJ SOLN
INTRAMUSCULAR | Status: DC | PRN
  Administered 2015-03-08: 30 mL via PERINEURAL

## 2015-03-08 MED ORDER — NEOSTIGMINE METHYLSULFATE 10 MG/10ML IV SOLN
INTRAVENOUS | Status: DC | PRN
  Administered 2015-03-08: 4 mg via INTRAVENOUS

## 2015-03-08 MED ORDER — ROCURONIUM BROMIDE 100 MG/10ML IV SOLN
INTRAVENOUS | Status: DC | PRN
  Administered 2015-03-08: 50 mg via INTRAVENOUS

## 2015-03-08 MED ORDER — PROPOFOL 10 MG/ML IV BOLUS
INTRAVENOUS | Status: DC | PRN
  Administered 2015-03-08: 200 mg via INTRAVENOUS

## 2015-03-08 MED ORDER — OXYCODONE HCL ER 10 MG PO T12A: 10.0000 mg | EXTENDED_RELEASE_TABLET | Freq: Two times a day (BID) | ORAL | Status: DC

## 2015-03-08 MED ORDER — SENNOSIDES-DOCUSATE SODIUM 8.6-50 MG PO TABS: 1.0000 | ORAL_TABLET | Freq: Every evening | ORAL | Status: DC | PRN

## 2015-03-08 MED ORDER — OXYCODONE HCL 5 MG PO TABS: 5.0000 mg | ORAL_TABLET | Freq: Once | ORAL | Status: DC | PRN

## 2015-03-08 MED ORDER — ACETAMINOPHEN 160 MG/5ML PO SOLN
325.0000 mg | ORAL | Status: DC | PRN
Start: 2015-03-08 — End: 2015-03-08
  Filled 2015-03-08: qty 20.3

## 2015-03-08 MED ORDER — CEFAZOLIN SODIUM-DEXTROSE 2-3 GM-% IV SOLR
INTRAVENOUS | Status: AC
  Filled 2015-03-08: qty 50

## 2015-03-08 MED ORDER — FENTANYL CITRATE (PF) 100 MCG/2ML IJ SOLN
INTRAMUSCULAR | Status: AC
  Administered 2015-03-08: 100 ug
  Filled 2015-03-08: qty 2

## 2015-03-08 MED ORDER — PHENYLEPHRINE HCL 10 MG/ML IJ SOLN
INTRAMUSCULAR | Status: DC | PRN
  Administered 2015-03-08 (×2): 40 ug via INTRAVENOUS
  Administered 2015-03-08: 80 ug via INTRAVENOUS

## 2015-03-08 SURGICAL SUPPLY — 61 items
BIT DRILL CANNULTD 2.4MMAC REP (DRILL) IMPLANT
BLADE AVERAGE 25X9 (BLADE) IMPLANT
BUTTON DOGBONE (Orthopedic Implant) ×2 IMPLANT
CHLORAPREP W/TINT 26ML (MISCELLANEOUS) ×2 IMPLANT
COVER SURGICAL LIGHT HANDLE (MISCELLANEOUS) ×2 IMPLANT
DRAPE C-ARM 42X72 X-RAY (DRAPES) IMPLANT
DRAPE IMP U-DRAPE 54X76 (DRAPES) ×2 IMPLANT
DRAPE INCISE IOBAN 66X45 STRL (DRAPES) ×2 IMPLANT
DRAPE SHEET LG 3/4 BI-LAMINATE (DRAPES) ×1 IMPLANT
DRAPE SURG 17X23 STRL (DRAPES) ×4 IMPLANT
DRAPE U-SHAPE 47X51 STRL (DRAPES) ×2 IMPLANT
DRILL CANNULATED 2.4MM AC REP (DRILL) ×2
DRSG MEPILEX BORDER 4X12 (GAUZE/BANDAGES/DRESSINGS) ×1 IMPLANT
DRSG MEPILEX BORDER 4X8 (GAUZE/BANDAGES/DRESSINGS) ×2 IMPLANT
ELECT REM PT RETURN 9FT ADLT (ELECTROSURGICAL)
ELECTRODE REM PT RTRN 9FT ADLT (ELECTROSURGICAL) IMPLANT
GLOVE BIOGEL PI IND STRL 7.0 (GLOVE) ×1 IMPLANT
GLOVE BIOGEL PI IND STRL 8 (GLOVE) ×1 IMPLANT
GLOVE BIOGEL PI INDICATOR 7.0 (GLOVE) ×1
GLOVE BIOGEL PI INDICATOR 8 (GLOVE) ×1
GLOVE PROGUARD SZ 7 1/2 (GLOVE) ×2 IMPLANT
GLOVE SKINSENSE NS SZ7.5 (GLOVE) ×1
GLOVE SKINSENSE STRL SZ7.5 (GLOVE) ×1 IMPLANT
GOWN STRL REUS W/ TWL LRG LVL3 (GOWN DISPOSABLE) ×2 IMPLANT
GOWN STRL REUS W/ TWL XL LVL3 (GOWN DISPOSABLE) ×1 IMPLANT
GOWN STRL REUS W/TWL LRG LVL3 (GOWN DISPOSABLE) ×4
GOWN STRL REUS W/TWL XL LVL3 (GOWN DISPOSABLE) ×2
KIT BASIN OR (CUSTOM PROCEDURE TRAY) ×2 IMPLANT
KIT BIO-TENODESIS 3X8 DISP (MISCELLANEOUS)
KIT INSRT BABSR STRL DISP BTN (MISCELLANEOUS) IMPLANT
KIT ROOM TURNOVER OR (KITS) ×2 IMPLANT
LOOP FIBERTAPE 2MM 8 WHT BLK (VASCULAR PRODUCTS) ×1 IMPLANT
LOOP FIBERTAPE 2MM BLUE (VASCULAR PRODUCTS) ×1 IMPLANT
MANIFOLD NEPTUNE II (INSTRUMENTS) ×2 IMPLANT
NDL HYPO 25GX1X1/2 BEV (NEEDLE) ×1 IMPLANT
NEEDLE HYPO 25GX1X1/2 BEV (NEEDLE) ×2 IMPLANT
NS IRRIG 1000ML POUR BTL (IV SOLUTION) ×2 IMPLANT
PACK SHOULDER (CUSTOM PROCEDURE TRAY) ×2 IMPLANT
PACK UNIVERSAL I (CUSTOM PROCEDURE TRAY) ×2 IMPLANT
PAD ARMBOARD 7.5X6 YLW CONV (MISCELLANEOUS) ×2 IMPLANT
RETRIEVER SUT HEWSON (MISCELLANEOUS) ×2 IMPLANT
SLING ARM LRG ADULT FOAM STRAP (SOFTGOODS) ×2 IMPLANT
SLING ARM MED ADULT FOAM STRAP (SOFTGOODS) IMPLANT
STRIP CLOSURE SKIN 1/2X4 (GAUZE/BANDAGES/DRESSINGS) ×2 IMPLANT
SUCTION FRAZIER HANDLE 10FR (MISCELLANEOUS) ×1
SUCTION TUBE FRAZIER 10FR DISP (MISCELLANEOUS) ×1 IMPLANT
SUPPORT WRAP ARM LG (MISCELLANEOUS) IMPLANT
SUT FIBERWIRE #2 38 T-5 BLUE (SUTURE) ×2
SUT MON AB 4-0 PC3 18 (SUTURE) ×2 IMPLANT
SUT PDS AB 1 CT  36 (SUTURE) ×1
SUT PDS AB 1 CT 36 (SUTURE) ×1 IMPLANT
SUT VIC AB 0 CT1 27 (SUTURE) ×2
SUT VIC AB 0 CT1 27XBRD ANBCTR (SUTURE) ×1 IMPLANT
SUT VIC AB 2-0 CT1 27 (SUTURE) ×2
SUT VIC AB 2-0 CT1 TAPERPNT 27 (SUTURE) ×1 IMPLANT
SUTURE FIBERWR #2 38 T-5 BLUE (SUTURE) ×1 IMPLANT
SYR CONTROL 10ML LL (SYRINGE) ×2 IMPLANT
TAPE FIBER 2MM 7IN #2 BLUE (SUTURE) IMPLANT
TOWEL OR 17X24 6PK STRL BLUE (TOWEL DISPOSABLE) ×2 IMPLANT
TOWEL OR 17X26 10 PK STRL BLUE (TOWEL DISPOSABLE) ×2 IMPLANT
WATER STERILE IRR 1000ML POUR (IV SOLUTION) ×2 IMPLANT

## 2015-03-08 NOTE — H&P (Signed)
    PREOPERATIVE H&P  Chief Complaint: left acromioclavicular joint separation  HPI: Colin Nichols is a 40 y.o. male who presents for surgical treatment of left acromioclavicular joint separation.  He denies any changes in medical history.  Past Medical History  Diagnosis Date  . Allergic   . Allergy   . Asthma   . Headache   . Anemia     hx    Past Surgical History  Procedure Laterality Date  . R ankle surgery    . Elbow fracture surgery     Social History   Social History  . Marital Status: Single    Spouse Name: N/A  . Number of Children: N/A  . Years of Education: N/A   Social History Main Topics  . Smoking status: Never Smoker   . Smokeless tobacco: Never Used  . Alcohol Use: Yes     Comment: occ; 1-2 drinks rarely  . Drug Use: No  . Sexual Activity: Not Asked   Other Topics Concern  . 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 Cancer Mother     cervical  . Hypertension Father   . Diabetes Paternal Grandfather    Allergies  Allergen Reactions  . Dust Mite Extract   . Lactose Intolerance (Gi)   . Pollen Extract   . Shellfish Allergy    Prior to Admission medications   Medication Sig Start Date End Date Taking? Authorizing Provider  EPINEPHrine (EPIPEN 2-PAK) 0.3 mg/0.3 mL SOAJ If needed for shortness of breath or swelling of lips/tongue, Inject 1 pen into thigh muscle and called 911.  May inject 2nd pen in 5 minutes if needed 07/23/12  Yes Graylon Good, PA-C  oxyCODONE-acetaminophen (PERCOCET/ROXICET) 5-325 MG tablet Take 2 tablets by mouth every 4 (four)  hours as needed for severe pain. 03/04/15  Yes Melton Krebs, PA-C     Positive ROS: All other systems have been reviewed and were otherwise negative with the exception of those mentioned in the HPI and as above.  Physical Exam: General: Alert, no acute distress Cardiovascular: No pedal edema Respiratory: No cyanosis, no use of accessory musculature GI: abdomen soft Skin: No lesions in the area of chief complaint Neurologic: Sensation intact distally Psychiatric: Patient is competent for consent with normal mood and affect Lymphatic: no lymphedema  MUSCULOSKELETAL: exam stable  Assessment: left acromioclavicular joint separation  Plan: Plan for Procedure(s): LEFT ACROMIOCLAVICULAR JOINT RECONSTRUCTION  The risks benefits and alternatives were discussed with the patient including but not limited to the risks of nonoperative treatment, versus surgical intervention including infection, bleeding, nerve injury,  blood clots, cardiopulmonary complications, morbidity, mortality, among others, and they were willing to proceed.   Cheral Almas, MD   03/08/2015 7:31 AM

## 2015-03-08 NOTE — Transfer of Care (Signed)
Immediate Anesthesia Transfer of Care Note  Patient: Colin Nichols  Procedure(s) Performed: Procedure(s): LEFT ACROMIOCLAVICULAR JOINT RECONSTRUCTION (Left)  Patient Location: PACU  Anesthesia Type:General and Regional  Level of Consciousness: awake, alert  and oriented  Airway & Oxygen Therapy: Patient Spontanous Breathing and Patient connected to nasal cannula oxygen  Post-op Assessment: Report given to RN and Post -op Vital signs reviewed and stable  Post vital signs: Reviewed and stable  Last Vitals:  Filed Vitals:   03/08/15 1316 03/08/15 1509  BP: 119/81 126/82  Pulse: 77   Temp: 37.1 C   Resp: 18     Complications: No apparent anesthesia complications

## 2015-03-08 NOTE — Discharge Instructions (Signed)
Postoperative instructions:  Weightbearing: non weight bearing, wear sling at all times until your first follow up appointment  Keep your dressing and/or splint clean and dry at all times.  You can remove your dressing on post-operative day #3 and change with a dry/sterile dressing or Band-Aids as needed thereafter.    Incision instructions:  Do not soak your incision for 3 weeks after surgery.  If the incision gets wet, pat dry and do not scrub the incision.  May shower 5 days after surgery.  Pain control:  You have been given a prescription to be taken as directed for post-operative pain control.  In addition, elevate the operative extremity above the heart at all times to prevent swelling and throbbing pain.  Take over-the-counter Colace,  by mouth twice a day while taking narcotic pain medications to help prevent constipation.  Follow up appointments: 1) 10-14 days for suture removal and wound check. 2) Dr. Roda Shutters as scheduled.   -------------------------------------------------------------------------------------------------------------  After Surgery Pain Control:  After your surgery, post-surgical discomfort or pain is likely. This discomfort can last several days to a few weeks. At certain times of the day your discomfort may be more intense.  Did you receive a nerve block?  A nerve block can provide pain relief for one hour to two days after your surgery. As long as the nerve block is working, you will experience little or no sensation in the area the surgeon operated on.  As the nerve block wears off, you will begin to experience pain or discomfort. It is very important that you begin taking your prescribed pain medication before the nerve block fully wears off. Treating your pain at the first sign of the block wearing off will ensure your pain is better controlled and more tolerable when full-sensation returns. Do not wait until the pain is intolerable, as the medicine will be less  effective. It is better to treat pain in advance than to try and catch up.  General Anesthesia:  If you did not receive a nerve block during your surgery, you will need to start taking your pain medication shortly after your surgery and should continue to do so as prescribed by your surgeon.  Pain Medication:  Most commonly we prescribe Vicodin and Percocet for post-operative pain. Both of these medications contain a combination of acetaminophen (Tylenol) and a narcotic to help control pain.   It takes between 30 and 45 minutes before pain medication starts to work. It is important to take your medication before your pain level gets too intense.   Nausea is a common side effect of many pain medications. You will want to eat something before taking your pain medicine to help prevent nausea.   If you are taking a prescription pain medication that contains acetaminophen, we recommend that you do not take additional over the counter acetaminophen (Tylenol).  Other pain relieving options:   Using a cold pack to ice the affected area a few times a day (15 to 20 minutes at a time) can help to relieve pain, reduce swelling and bruising.   Elevation of the affected area can also help to reduce pain and swelling.

## 2015-03-08 NOTE — Anesthesia Preprocedure Evaluation (Signed)
Anesthesia Evaluation  Patient identified by MRN, date of birth, ID band Patient awake    Reviewed: Allergy & Precautions, NPO status , Patient's Chart, lab work & pertinent test results  History of Anesthesia Complications Negative for: history of anesthetic complications  Airway Mallampati: II  TM Distance: >3 FB Neck ROM: Full    Dental  (+) Teeth Intact   Pulmonary neg pulmonary ROS,    breath sounds clear to auscultation       Cardiovascular negative cardio ROS   Rhythm:Regular     Neuro/Psych  Headaches, negative psych ROS   GI/Hepatic negative GI ROS, Neg liver ROS,   Endo/Other  negative endocrine ROS  Renal/GU negative Renal ROS     Musculoskeletal   Abdominal   Peds  Hematology negative hematology ROS (+)   Anesthesia Other Findings   Reproductive/Obstetrics                             Anesthesia Physical Anesthesia Plan  ASA: I  Anesthesia Plan: General and Regional   Post-op Pain Management:    Induction: Intravenous  Airway Management Planned: Oral ETT  Additional Equipment: None  Intra-op Plan:   Post-operative Plan: Extubation in OR  Informed Consent: I have reviewed the patients History and Physical, chart, labs and discussed the procedure including the risks, benefits and alternatives for the proposed anesthesia with the patient or authorized representative who has indicated his/her understanding and acceptance.   Dental advisory given  Plan Discussed with: CRNA and Surgeon  Anesthesia Plan Comments:         Anesthesia Quick Evaluation

## 2015-03-08 NOTE — Op Note (Signed)
   Date of Surgery: 03/08/2015  INDICATIONS: Mr. Schewe is a 40 y.o.-year-old male with a left grade 5 AC separation;  The patient did consent to the procedure after discussion of the risks and benefits.  PREOPERATIVE DIAGNOSIS: Left grade 5 AC separation  POSTOPERATIVE DIAGNOSIS: Same.  PROCEDURE: Left AC separation reconstruction  SURGEON: N. Glee Arvin, M.D.  ASSIST: Hart Carwin, RNFA.  ANESTHESIA:  general, regional  IV FLUIDS AND URINE: See anesthesia.  ESTIMATED BLOOD LOSS: 100 mL.  IMPLANTS: Arthrex  DRAINS: none  COMPLICATIONS: None.  DESCRIPTION OF PROCEDURE: The patient was brought to the operating room and placed supine on the operating table.  The patient had been signed prior to the procedure and this was documented. The patient had the anesthesia placed by the anesthesiologist.  A time-out was performed to confirm that this was the correct patient, site, side and location. The patient did receive antibiotics prior to the incision and was re-dosed during the procedure as needed at indicated intervals.  A tourniquet not placed.  The patient had the operative extremity prepped and draped in the standard surgical fashion.    He was placed in the beachchair position. A longitudinal incision from the acromioclavicular joint down towards the tip of the coracoid was used.  Full-thickness flaps were created. The fascia was released. The acromioclavicular joint was first identified. The acromioclavicular ligament was completely torn. The distal clavicle was superiorly displaced. We then created a tunnel deep to the deltoid muscle down towards the coracoid. The coracoid was then exposed. We then drilled the appropriate holes through the distal clavicle and the coracoid. A nitinol wire was passed from superior to inferior and then a suture with dog bone was passed back up through the holes using the wire. The dog bone was then flipped. Fluoroscopy was used to confirm this. We then  reduced the acromioclavicular joint. Placed a another dog bone on the superior aspect of the distal clavicle and this was tightened down. Final fluoroscopy x-rays were taken. The wound was thoroughly irrigated. The acromioclavicular ligament was repaired with 0 Vicryl. The rest of the surgical wound was closed in layer fashion using 2-0 Vicryl and 4-0 Monocryl. Steri-Strips were applied. Sterile dressings were applied. Patient tolerated procedure well and had no immediate complications.  POSTOPERATIVE PLAN: Patient will be nonweightbearing to the left upper extremity.  Mayra Reel, MD [facility] [phone] 5:10 PM

## 2015-03-08 NOTE — Anesthesia Procedure Notes (Addendum)
Anesthesia Regional Block:  Interscalene brachial plexus block  Pre-Anesthetic Checklist: ,, timeout performed, Correct Patient, Correct Site, Correct Laterality, Correct Procedure, Correct Position, site marked, Risks and benefits discussed,  Surgical consent,  Pre-op evaluation,  At surgeon's request and post-op pain management  Laterality: Upper and Left  Prep: chloraprep       Needles:  Injection technique: Single-shot  Needle Type: Echogenic Stimulator Needle          Additional Needles:  Procedures: ultrasound guided (picture in chart) and nerve stimulator Interscalene brachial plexus block  Nerve Stimulator or Paresthesia:  Response: deltoid, 0.5 mA,   Additional Responses:   Narrative:  Injection made incrementally with aspirations every 5 mL.  Performed by: Personally  Anesthesiologist: Val Eagle  Additional Notes: H+P and labs reviewed, risks and benefits discussed with patient, procedure tolerated well without complications   Procedure Name: Intubation Date/Time: 03/08/2015 3:42 PM Performed by: Dairl Ponder Pre-anesthesia Checklist: Patient identified, Timeout performed, Emergency Drugs available, Suction available and Patient being monitored Patient Re-evaluated:Patient Re-evaluated prior to inductionOxygen Delivery Method: Circle system utilized Preoxygenation: Pre-oxygenation with 100% oxygen Intubation Type: IV induction Ventilation: Mask ventilation without difficulty Grade View: Grade I Tube type: Oral Tube size: 7.5 mm Number of attempts: 1 Airway Equipment and Method: Stylet Placement Confirmation: ETT inserted through vocal cords under direct vision,  breath sounds checked- equal and bilateral and positive ETCO2 Secured at: 23 cm Tube secured with: Tape Dental Injury: Teeth and Oropharynx as per pre-operative assessment

## 2015-03-09 NOTE — Anesthesia Postprocedure Evaluation (Signed)
Anesthesia Post Note  Patient: Colin Nichols  Procedure(s) Performed: Procedure(s) (LRB): LEFT ACROMIOCLAVICULAR JOINT RECONSTRUCTION (Left)  Patient location during evaluation: PACU Anesthesia Type: General and Regional Level of consciousness: awake and alert Pain management: pain level controlled Vital Signs Assessment: post-procedure vital signs reviewed and stable Respiratory status: spontaneous breathing, nonlabored ventilation, respiratory function stable and patient connected to nasal cannula oxygen Cardiovascular status: blood pressure returned to baseline and stable Postop Assessment: no signs of nausea or vomiting Anesthetic complications: no    Last Vitals:  Filed Vitals:   03/08/15 1900 03/08/15 1930  BP:    Pulse: 76   Temp:  36.8 C  Resp: 19     Last Pain:  Filed Vitals:   03/08/15 1942  PainSc: 0-No pain                 Phillips Grout

## 2015-03-12 ENCOUNTER — Encounter (HOSPITAL_COMMUNITY): Payer: Self-pay | Admitting: Orthopaedic Surgery

## 2017-05-05 ENCOUNTER — Ambulatory Visit: Payer: BLUE CROSS/BLUE SHIELD | Admitting: Family Medicine

## 2017-05-05 ENCOUNTER — Encounter: Payer: Self-pay | Admitting: Family Medicine

## 2017-05-05 VITALS — BP 90/60 | HR 81 | Temp 98.1°F | Ht 71.0 in | Wt 205.9 lb

## 2017-05-05 DIAGNOSIS — J4520 Mild intermittent asthma, uncomplicated: Secondary | ICD-10-CM

## 2017-05-05 DIAGNOSIS — R229 Localized swelling, mass and lump, unspecified: Secondary | ICD-10-CM | POA: Diagnosis not present

## 2017-05-05 DIAGNOSIS — Z9109 Other allergy status, other than to drugs and biological substances: Secondary | ICD-10-CM | POA: Diagnosis not present

## 2017-05-05 DIAGNOSIS — R739 Hyperglycemia, unspecified: Secondary | ICD-10-CM

## 2017-05-05 MED ORDER — ALBUTEROL SULFATE HFA 108 (90 BASE) MCG/ACT IN AERS: 2.0000 | INHALATION_SPRAY | Freq: Four times a day (QID) | RESPIRATORY_TRACT | 0 refills | Status: AC | PRN

## 2017-05-05 MED ORDER — EPINEPHRINE 0.3 MG/0.3ML IJ SOAJ: INTRAMUSCULAR | 1 refills | Status: AC

## 2017-05-05 NOTE — Addendum Note (Signed)
Addended by: Terressa KoyanagiKIM, HANNAH R on: 05/05/2017 05:23 PM   Modules accepted: Level of Service

## 2017-05-05 NOTE — Patient Instructions (Signed)
BEFORE YOU LEAVE: -labs -follow up: CPE/follow up 6 months  Sent refills to pharmacy  Let us know if you want further evaluation of the lump  We have ordered labs or studies at this visit. It can take up to 1-2 weeks for results and processing. IF results require follow up or explanation, we will call you with instructions. Clinically stable results will be released to your [facility]. If you have not heard from us or cannot find your results in [facility] in 2 weeks please contact our office at [phone].  If you are not yet signed up for [facility], please consider signing up.   We recommend the following healthy lifestyle for LIFE: 1) Small portions. But, make sure to get regular (at least 3 per day), healthy meals and small healthy snacks if needed.  2) Eat a healthy clean diet.   TRY TO EAT: -at least 5-7 servings of low sugar, colorful, and nutrient rich vegetables per day (not corn, potatoes or bananas.) -berries are the best choice if you wish to eat fruit (only eat small amounts if trying to reduce weight)  -lean meets (fish, white meat of chicken or [place]) -vegan proteins for some meals - beans or tofu, whole grains, nuts and seeds -Replace bad fats with good fats - good fats include: fish, nuts and seeds, canola oil, olive oil -small amounts of low fat or non fat dairy -small amounts of100 % whole grains - check the lables -drink plenty of water  AVOID: -SUGAR, sweets, anything with added sugar, corn syrup or sweeteners - must read labels as even foods advertised as "healthy" often are loaded with sugar -if you must have a sweetener, small amounts of stevia may be best -sweetened beverages and artificially sweetened beverages -simple starches (rice, bread, potatoes, pasta, chips, etc - small amounts of 100% whole grains are ok) -red meat, pork, butter -fried foods, fast food, processed food, excessive dairy, eggs and coconut.  3)Get at least 150 minutes of sweaty aerobic  exercise per week.  4)Reduce stress - consider counseling, meditation and relaxation to balance other aspects of your life.

## 2017-05-05 NOTE — Progress Notes (Signed)
HPI:  Colin Nichols is a pleasant 42 yo with a PMH prediabetes and mild intermittent asthma here here to re-establish care. Not seen since 2015.  Has the following chronic problems that require follow up and concerns today:  Hx asthma/allergies: -no symptoms currently -occ uses alb if gets a little wheezy around dust -hx shellfish allergy, not sure but his epi may be expired  Hx hyperglycemia -feels diet is ok, very active at work - currently loading/unloading for [employer]  Lump L flank: -noticed a few weeks ago, seemed larger, now smaller -no pain, redness, fevers, drainage per his report   ROS negative for unless reported above: fevers, unintentional weight loss, hearing or vision loss, chest pain, palpitations, struggling to breath, hemoptysis, melena, hematochezia, hematuria, falls, loc, si, thoughts of self harm  Past Medical History:  Diagnosis Date  . Allergic   . Allergy   . Anemia    hx   . Asthma   . Headache     Past Surgical History:  Procedure Laterality Date  . ACROMIO-CLAVICULAR JOINT REPAIR Left 03/08/2015   Procedure: LEFT ACROMIOCLAVICULAR JOINT RECONSTRUCTION;  Surgeon: Colin KosNaiping M Xu, MD;  Location: [facility] OR;  Service: Orthopedics;  Laterality: Left;  . ELBOW FRACTURE SURGERY    . R ankle surgery      Family History  Problem Relation Age of Onset  . Cancer Mother        cervical  . Hypertension Father   . Diabetes Paternal Grandfather     Social History   Socioeconomic History  . Marital status: Single    Spouse name: Not on file  . Number of children: Not on file  . Years of education: Not on file  . Highest education level: Not on file  Occupational History  . Not on file  Social Needs  . Financial resource strain: Not on file  . Food insecurity:    Worry: Not on file    Inability: Not on file  . Transportation needs:    Medical: Not on file    Non-medical: Not on file  Tobacco Use  . Smoking status: Never Smoker  . Smokeless  tobacco: Never Used  Substance and Sexual Activity  . Alcohol use: Yes    Comment: occ; 1-2 drinks rarely  . Drug use: No  . Sexual activity: Not on file  Lifestyle  . Physical activity:    Days per week: Not on file    Minutes per session: Not on file  . Stress: Not on file  Relationships  . Social connections:    Talks on phone: Not on file    Gets together: Not on file    Attends religious service: Not on file    Active member of club or organization: Not on file    Attends meetings of clubs or organizations: Not on file    Relationship status: Not on file  Other Topics Concern  . 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  albuterol (PROVENTIL HFA;VENTOLIN HFA) 108 (90 Base) MCG/ACT inhaler, Inhale  2 puffs into the lungs every 6 (six) hours as needed., Disp: 1 Inhaler, Rfl: 0 .  EPINEPHrine (EPIPEN 2-PAK) 0.3 mg/0.3 mL IJ SOAJ injection, If needed for shortness of breath or swelling of lips/tongue, Inject 1 pen into thigh muscle and called 911.  May inject 2nd pen in 5 minutes if needed, Disp: 1 Device, Rfl: 1  EXAM:  Vitals:   05/05/17 1514  BP: 90/60  Pulse: 81  Temp: 98.1 F (36.7 C)    Body mass index is 28.72 kg/m.  GENERAL: vitals reviewed and listed above, alert, oriented, appears well hydrated and in no acute distress  HEENT: atraumatic, conjunttiva clear, no obvious abnormalities on inspection of external nose and ears  NECK: no obvious masses on inspection  LUNGS: clear to auscultation bilaterally, no wheezes, rales or rhonchi, good air movement  CV: HRRR, no peripheral edema  SKIN: soft subcut mass L lat lower flank - mobile, nttp, no  redness, ~1-2 cm in diameter  MS: moves all extremities without noticeable abnormality  PSYCH: pleasant and cooperative, no obvious depression or anxiety  ASSESSMENT AND PLAN:  Discussed the following assessment and plan:  Hyperglycemia - Plan: Hemoglobin A1c, HDL cholesterol, Cholesterol, total -lifestyle recs  Mass of skin -we discussed possible serious and likely etiologies, workup and treatment, treatment risks and return precautions - ? Cyst vs lypoma, offered referral for surgical resection for evaluation -after this discussion, Colin Nichols opted for observation -of course, we advised Colin Nichols  to return or notify a doctor immediately if symptoms worsen or persist or new concerns arise.  Environmental allergies -hx severe shellfish allergy, epi refiled  Mild intermittent reactive airway disease without complication -alb refilled   -Patient advised to return or notify a doctor immediately if symptoms worsen or persist or new concerns arise.  Patient Instructions  BEFORE YOU LEAVE: -labs -follow up: CPE/follow up 6 months  Sent refills to pharmacy  Let us know if you want further evaluation of the lump  We have ordered labs or studies at this visit. It can take up to 1-2 weeks for results and processing. IF results require follow up or explanation, we will call you with instructions. Clinically stable results will be released to your [facility]. If you have not heard from [place] or cannot find your results in [facility] in 2 weeks please contact our office at [phone].  If you are not yet signed up for [facility], please consider signing up.   We recommend the following healthy lifestyle for LIFE: 1) Small portions. But, make sure to get regular (at least 3 per day), healthy meals and small healthy snacks if needed.  2) Eat a healthy clean diet.   TRY TO EAT: -at least 5-7 servings of low sugar, colorful, and nutrient rich vegetables per day (not corn, potatoes or bananas.) -berries  are the best choice if you wish to eat fruit (only eat small amounts if trying to reduce weight)  -lean meets (fish, white meat of chicken or [place]) -vegan proteins for some meals - beans or tofu, whole grains, nuts and seeds -Replace bad fats with good fats - good fats include: fish, nuts and seeds, canola oil, olive oil -small amounts of low fat or non fat dairy -small amounts of100 % whole grains - check the lables -drink plenty of water  AVOID: -SUGAR, sweets, anything with added sugar, corn syrup or sweeteners - must read labels as even foods advertised as "healthy" often are loaded with sugar -if you must have a  sweetener, small amounts of stevia may be best -sweetened beverages and artificially sweetened beverages -simple starches (rice, bread, potatoes, pasta, chips, etc - small amounts of 100% whole grains are ok) -red meat, pork, butter -fried foods, fast food, processed food, excessive dairy, eggs and coconut.  3)Get at least 150 minutes of sweaty aerobic exercise per week.  4)Reduce stress - consider counseling, meditation and relaxation to balance other aspects of your life.           Terressa Koyanagi

## 2017-05-06 LAB — HDL CHOLESTEROL: HDL: 39.2 mg/dL (ref >39.00)

## 2017-05-06 LAB — HEMOGLOBIN A1C: HEMOGLOBIN A1C: 5.8 % (ref 4.6–6.5)

## 2017-05-06 LAB — CHOLESTEROL, TOTAL: CHOLESTEROL: 146 mg/dL (ref 0–200)

## 2017-09-01 IMAGING — DX DG CHEST 1V PORT
1 series · 1 of 1 positions shown · non-contrast
Comparison: None.

CLINICAL DATA: Left AC joint reconstruction.

EXAM:
PORTABLE CHEST 1 VIEW

[chest ap]
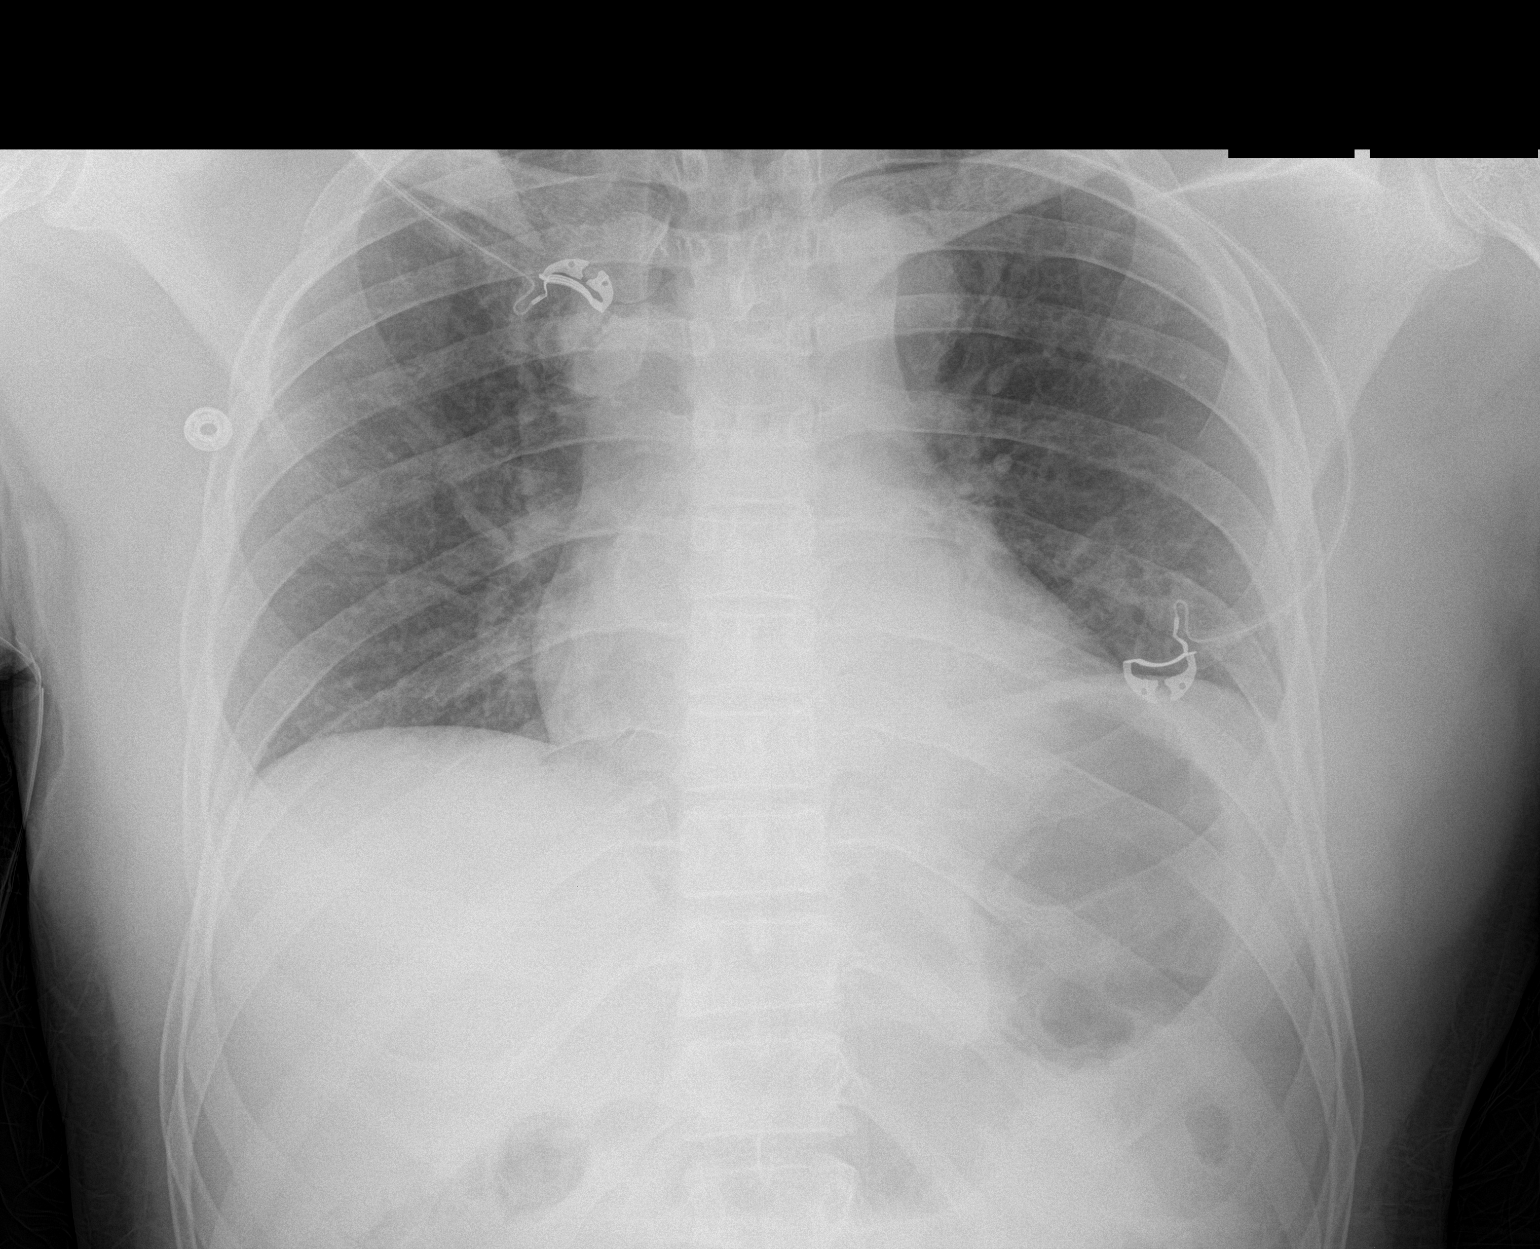

[1 of 1 positions shown; findings below may reference images not displayed]

FINDINGS: Low volume chest with interstitial crowding. There is no edema,
consolidation, effusion, or pneumothorax. No cardiomegaly.
Unremarkable upper mediastinal contours for technique.
IMPRESSION: Low volume but clear chest.

## 2017-11-05 ENCOUNTER — Ambulatory Visit: Payer: BLUE CROSS/BLUE SHIELD | Admitting: Family Medicine

## 2017-11-12 ENCOUNTER — Ambulatory Visit: Payer: Self-pay | Admitting: Family Medicine

## 2017-11-12 NOTE — Progress Notes (Deleted)
  HPI:  Using dictation device. Unfortunately this device frequently misinterprets words/phrases.  Colin Nichols is a pleasant 42 y.o. here for follow up. Chronic medical problems summarized below were reviewed for changes and stability and were updated as needed below. These issues and their treatment remain stable for the most part. ***. Denies CP, SOB, DOE, treatment intolerance or new symptoms.  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  Past Medical History:  Diagnosis Date  . Allergic   . Allergy   . Anemia    hx   . Asthma   . Headache     Past Surgical History:  Procedure Laterality Date  . ACROMIO-CLAVICULAR JOINT REPAIR Left 03/08/2015   Procedure: LEFT ACROMIOCLAVICULAR JOINT RECONSTRUCTION;  Surgeon: Tarry Kos, MD;  Location: [facility] OR;  Service: Orthopedics;  Laterality: Left;  . ELBOW FRACTURE SURGERY    . R ankle surgery      Family History  Problem Relation Age of Onset  . Cancer Mother        cervical  . Hypertension Father   . Diabetes Paternal Grandfather     SOCIAL HX: ***   Current Outpatient Medications:  .  albuterol (PROVENTIL HFA;VENTOLIN HFA) 108 (90 Base) MCG/ACT inhaler, Inhale 2 puffs into the lungs every 6 (six) hours as needed., Disp: 1 Inhaler, Rfl: 0 .  EPINEPHrine (EPIPEN 2-PAK) 0.3 mg/0.3 mL IJ SOAJ injection, If needed for shortness of breath or swelling of lips/tongue, Inject 1 pen into thigh muscle and called 911.  May inject 2nd pen in 5 minutes if needed, Disp: 1 Device, Rfl: 1  EXAM:  There were no vitals filed for this visit.  There is no height or weight on file to calculate BMI.  GENERAL: vitals reviewed and listed above, alert,  oriented, appears well hydrated and in no acute distress  HEENT: atraumatic, conjunttiva clear, no obvious abnormalities on inspection of external nose and ears  NECK: no obvious masses on inspection  LUNGS: clear to auscultation bilaterally, no wheezes, rales or rhonchi, good air movement  CV: HRRR, no peripheral edema  MS: moves all extremities without noticeable abnormality *** PSYCH: pleasant and cooperative, no obvious depression or anxiety  ASSESSMENT AND PLAN:  Discussed the following assessment and plan:  No diagnosis found.  *** -Patient advised to return or notify a doctor immediately if symptoms worsen or persist or new concerns arise.  There are no Patient Instructions on file for this visit.  Terressa Koyanagi, DO
